# Patient Record
Sex: Male | Born: 1940 | Race: White | Hispanic: No | Marital: Married | Smoking: Never smoker
Health system: Southern US, Community
[De-identification: ages and names within clinical notes are randomized; demographics above are authoritative.]

## PROBLEM LIST (undated history)

## (undated) DIAGNOSIS — E7800 Pure hypercholesterolemia, unspecified: Secondary | ICD-10-CM

## (undated) DIAGNOSIS — I10 Essential (primary) hypertension: Secondary | ICD-10-CM

---

## 2001-03-14 ENCOUNTER — Ambulatory Visit (HOSPITAL_COMMUNITY): Admission: RE | Admit: 2001-03-14 | Discharge: 2001-03-14 | Payer: Self-pay | Admitting: Gastroenterology

## 2001-03-14 ENCOUNTER — Encounter (INDEPENDENT_AMBULATORY_CARE_PROVIDER_SITE_OTHER): Payer: Self-pay | Admitting: Specialist

## 2004-05-31 ENCOUNTER — Ambulatory Visit (HOSPITAL_COMMUNITY): Admission: RE | Admit: 2004-05-31 | Discharge: 2004-05-31 | Payer: Self-pay | Admitting: Gastroenterology

## 2004-11-18 ENCOUNTER — Ambulatory Visit (HOSPITAL_COMMUNITY): Admission: RE | Admit: 2004-11-18 | Discharge: 2004-11-18 | Payer: Self-pay | Admitting: Internal Medicine

## 2005-01-04 ENCOUNTER — Ambulatory Visit (HOSPITAL_BASED_OUTPATIENT_CLINIC_OR_DEPARTMENT_OTHER): Admission: RE | Admit: 2005-01-04 | Discharge: 2005-01-04 | Payer: Self-pay | Admitting: Otolaryngology

## 2005-01-16 ENCOUNTER — Ambulatory Visit: Payer: Self-pay | Admitting: Internal Medicine

## 2005-08-12 ENCOUNTER — Ambulatory Visit (HOSPITAL_COMMUNITY): Admission: RE | Admit: 2005-08-12 | Discharge: 2005-08-13 | Payer: Self-pay | Admitting: Otolaryngology

## 2005-08-12 ENCOUNTER — Encounter (INDEPENDENT_AMBULATORY_CARE_PROVIDER_SITE_OTHER): Payer: Self-pay | Admitting: *Deleted

## 2005-10-13 ENCOUNTER — Ambulatory Visit (HOSPITAL_BASED_OUTPATIENT_CLINIC_OR_DEPARTMENT_OTHER): Admission: RE | Admit: 2005-10-13 | Discharge: 2005-10-13 | Payer: Self-pay | Admitting: Otolaryngology

## 2005-10-16 ENCOUNTER — Ambulatory Visit: Payer: Self-pay | Admitting: Internal Medicine

## 2008-04-21 ENCOUNTER — Encounter: Admission: RE | Admit: 2008-04-21 | Discharge: 2008-04-21 | Payer: Self-pay | Admitting: Internal Medicine

## 2008-05-08 ENCOUNTER — Ambulatory Visit (HOSPITAL_COMMUNITY): Admission: RE | Admit: 2008-05-08 | Discharge: 2008-05-08 | Payer: Self-pay | Admitting: Internal Medicine

## 2008-05-08 ENCOUNTER — Encounter (INDEPENDENT_AMBULATORY_CARE_PROVIDER_SITE_OTHER): Payer: Self-pay | Admitting: Internal Medicine

## 2010-09-10 NOTE — Op Note (Signed)
NAME:  Alexander Cameron, Alexander Cameron               ACCOUNT NO.:  [id number]   MEDICAL RECORD NO.:  [id number]          PATIENT TYPE:  AMB   LOCATION:  SDS                          FACILITY:  [facility]   PHYSICIAN:  Kinnie Scales. Annalee Genta, M.D.DATE OF BIRTH:  09/26/1940   DATE OF PROCEDURE:  08/12/2005  DATE OF DISCHARGE:                                 OPERATIVE REPORT   PREOPERATIVE DIAGNOSIS:  1.  Obstructive sleep apnea.  2.  Deviated nasal septum with nasal airway obstruction.  3.  Tonsillar hypertrophy.  4.  Uvula palatal hypertrophy.  5.  Inferior turbinate hypertrophy.   POSTOPERATIVE DIAGNOSIS:  1.  Obstructive sleep apnea.  2.  Deviated nasal septum with nasal airway obstruction.  3.  Tonsillar hypertrophy.  4.  Uvula palatal hypertrophy.  5.  Inferior turbinate hypertrophy.   PROCEDURE:  1.  Nasal septoplasty.  2.  Bilateral inferior turbinate reduction.  3.  Uvulopalatopharyngoplasty.  4.  Tonsillectomy.   SURGEON:  Kinnie Scales. Annalee Genta, M.D.   COMPLICATIONS:  None.   ESTIMATED BLOOD LOSS:  50 mL.   ANESTHESIA:  General endotracheal.   SPECIMEN:  To pathology.   DISPOSITION:  The patient is transferred from the operating room to the  recovery in stable condition.   BRIEF HISTORY:  [name] is a 70 year old white male who is referred for  evaluation of obstructive sleep apnea. The patient's sleep study showed  moderately severe sleep apnea and examination revealed a severely deviated  septum with significant adenotonsillar and palatal hypertrophy. The patient  was titrated on CPAP but over the long-term, was unable where CPAP on a  consistent basis and had limited improvement in symptoms of daytime fatigue  and hypersomnolence. Given his failure to respond to CPAP and physical  examination, we discussed the various surgical treatment options to improve  his obstructive sleep apnea.  The risks, benefits, and possible  complications of the above surgical procedure were discussed  in detail with  the patient and his wife and they understood and concurred with our plan for  surgery which was scheduled for [facility] OR on August 12, 2005.   SURGICAL PROCEDURES:  The patient was brought to the operating room at [facility] OR and placed in the supine position on the operating  table. General endotracheal anesthesia was established without difficulty.  The patient was adequately anesthetized. His nasal cavity was injected with  a total of 8 mL of 1% lidocaine with 1:100,000 epinephrine injected in  submucosal fashion on the nasal septum and inferior turbinates bilaterally.  The patient was positioned on the operating table and prepped and draped in  a sterile fashion. The surgical procedure was begun with nasal septoplasty.  A left anterior hemitransfixion incision was created and a mucoperichondrial  flap was elevated from anterior to posterior on the patient's left-hand  side. The patient had a severely deviated nasal septum with near complete  obstruction of the right nasal cavity and a large left inferior septal bony  spur. This was mobilized using a 4 mm osteotome preserving the overlying  mucosa  and was removed without difficulty. The bony cartilaginous junction  was then crossed and the mucoperiosteal flap was elevated on the patient's  right-hand side. Severely deviated bone and cartilage were then resected  with a 4 mm osteotome in the mid posterior aspects of the nasal septum in  order to create a midline septum. The mid septal cartilage was removed,  morselized, and returned to the mucoperichondrial pocket and the anterior  dorsal and columellar cartilage was not implemented as this was not  significantly deviated. With morselized cartilage returned to the  mucoperichondrial pocket, the flaps were reapproximated with a 4-0 gut  suture on a Keith needle in a horizontal mattress fashion. The anterior  hemitransfixion incision was  closed with the same stitch and bilateral Doyle  nasal septal splints were placed after the application of Bactroban ointment  and sutured into position with a 3-0 Ethilon suture.   Inferior turbinate reduction was then performed beginning with bipolar  intramural cautery. Two submucosal passes were made into each inferior  turbinate. When the turbinates were adequately cauterized, they were  outfractured creating a more patent nasal cavity. The anterior inferior  aspect of each inferior turbinate was then exposed and turbinate bone was  partially resected using a through cutting forceps, preserving the overlying  mucosa.  The turbinates were then further outfractured to create a patent  nasal cavity.  The nasal cavity, nasopharynx, oral cavity, and oropharynx  were then irrigated and suctioned.   With the nasal surgery completed, attention then turned the patient's oral  pharynx. A Crowe-Davis mouth gag was inserted without difficulty. There were  no loose or broken teeth and the entire oropharynx was exposed and  inspected. The patient had significant uvulopalatal hypertrophy with  tonsillar enlargement.  A tonsillectomy was begun on the patient's left-hand  side using Bovie electrocautery and dissecting in subcapsular fashion. The  entire left tonsil was resected from superior pole to tongue base.  The  right tonsil was removed in similar fashion and the tonsillar tissue sent to  pathology for gross and microscopic evaluation. The tonsillar fossa gently  abraded with a dry tonsil sponge.  Several areas of point hemorrhage were  cauterized with suction cautery. There is no active bleeding.   Uvulopalatopharyngoplasty was then performed. Using the Bovie  electrocautery, an anterior mucosal flap was elevated along the anterior  tonsillar pillars across the palate. This was extended through the mucosa and underlying submucosa and the muscular layer of the palate exposed.  Dissection was  then carried out from superior to inferior removing muscle  and preserving posterior palatal mucosa. An approximately 1.5 cm strip of  muscle and uvula was resected and this was sent to pathology. This created a  widely patent oropharynx.  Posterior palatal mucosa was then advanced and  the posterior tonsillar pillars were approximated anteriorly using a 3-0  Vicryl suture on a tapered needle in an interrupted mattress fashion. The  posterior palatal mucosa was brought anteriorly to cover the palatal margin  and good mucosal contact was achieved throughout. The patient's oral cavity  and oropharynx were then irrigated and suctioned.  An orogastric tube was  passed and the stomach contents were aspirated. The Crowe-Davis  mouth gag was released and reapplied, there was no active bleeding.  The  mouth gag was removed. The patient was then awakened from his anesthetic,  extubated, and was transferred from the operating room to the recovery room  in stable condition. There were no complications. Blood loss  was  approximately 50 mL.           ______________________________  Kinnie Scales. Annalee Genta, M.D.     DLS/MEDQ  D:  45/40/9811  T:  08/12/2005  Job:  [id number]

## 2010-09-10 NOTE — Op Note (Signed)
NAME:  Alexander Cameron, Alexander Cameron               ACCOUNT NO.:  [id number]   MEDICAL RECORD NO.:  [id number]          PATIENT TYPE:  AMB   LOCATION:  ENDO                         [facility]ACILITY:  [facility]   PHYSICIAN:  James L. Malon Kindle., M.D.DATE OF BIRTH:  24-Oct-1940   DATE OF PROCEDURE:  05/31/2004  DATE OF DISCHARGE:                                 OPERATIVE REPORT   PROCEDURE:  Colonoscopy.   MEDICATIONS:  Fentanyl 50 mcg, Versed 6 mg IV.   INSTRUMENT USED:  Olympus pediatric adjustable colonoscope.   INDICATIONS FOR PROCEDURE:  The patient has had a previous history of  adenomatous polyps.  This is done as a followup procedure.   DESCRIPTION OF PROCEDURE:  The procedure had been explained to the patient  and consent obtained.  With the patient in the left lateral decubitus  position, the Olympus scope was inserted and advanced.  The prep was  excellent.  We were able to reach the cecum without difficulty.  The  ileocecal valve and appendiceal orifice were seen.  The scope was withdrawn.  The cecum, ascending colon, transverse colon, splenic flexure, descending  colon, and sigmoid colon were seen well, and no polyps or other lesions were  seen.  The scope was withdrawn.  The patient tolerated the procedure well.   ASSESSMENT:  History of adenomatous colon polyps with negative colonoscopy  at this time.  V12.72.   PLAN:  Will recommend yearly Hemoccults and repeat procedure in five years  due to previous history of polyps.      JLE/MEDQ  D:  05/31/2004  T:  05/31/2004  Job:  [id number]   cc:   Antony Madura, M.D.  [address], [address]  [place]  [place] [address]  Fax: [phone]

## 2010-09-10 NOTE — Procedures (Signed)
NAME:  Alexander Cameron, Alexander Cameron NO.:  [id number]   MEDICAL RECORD NO.:  [id number]          PATIENT TYPE:  OUT   LOCATION:  SLEEP CENTER                 FACILITY:  [facility]   PHYSICIAN:  Clinton D. Maple Hudson, M.D. DATE OF BIRTH:  11-Dec-1940   DATE OF STUDY:  01/04/2005                              NOCTURNAL POLYSOMNOGRAM   REFERRING PHYSICIAN:  Dr. Osborn Coho.   DATE OF STUDY:  January 04, 2005.   INDICATION FOR STUDY:  Hypersomnia with sleep apnea. Epworth sleepiness  score 5/24, BMI 33. Weight 211 pounds.   SLEEP ARCHITECTURE:  Total sleep time 322 minutes with sleep efficiency 78%.  Stage I was 7%, stage II 80%, stages III and IV absent, REM 13% of total  sleep time. Sleep latency 43 minutes, REM latency 207 minutes, awake after  sleep onset 47 minutes, arousal index 44. No bedtime medication was  reported.   RESPIRATORY DATA:  Split study protocol. Apnea/hypopnea index (AHI, RDI)  72.1 obstructive events per hour indicating severe obstructive sleep  apnea/hypopnea syndrome before CPAP. There were 91 obstructive apneas and 63  hypopneas before CPAP. Virtually all sleep was while supine. REM AHI 0. CPAP  was titrated to 16 CWP, AHI of 0 per hour, to eliminate snoring. A medium  full-face Ultra Mirage mask was used with heated humidifier.   OXYGEN DATA:  Very loud snoring with oxygen desaturation to a nadir of 81%  before CPAP. After CPAP control oxygen saturation held 94% to 96% on room  air.   CARDIAC DATA:  Sinus rhythm with sinus bradycardia, heart rate 48-52 beats  per minute.   MOVEMENT/PARASOMNIA:  A total of 284 limb jerks were reported of which 27  were associated with arousal or awakening for periodic limb movement with  arousal index of 5 per hour which is increased. This may partly reflect the  presence of sleep apnea arousals and can be reconsidered after sleep apnea  is treated.   IMPRESSION/RECOMMENDATIONS:  1.  Severe obstructive sleep apnea /  hypopnea syndrome, apnea/hypopnea index      72.1 per hour, with very loud snoring and oxygen desaturation to 81%.  2.  Successful CPAP titration to 16 centimeter of water pressure,      apnea/hypopnea index 0 per hour, using a medium full-face Ultra Mirage      mask with heated humidifier.  3.  Sinus bradycardia.  4.  Periodic limb movement with arousal, 5 per hour.      Clinton D. Maple Hudson, M.D.  Diplomate, Biomedical engineer of Sleep Medicine  Electronically Signed     CDY/MEDQ  D:  01/16/2005 08:31:57  T:  01/16/2005 14:07:27  Job:  [id number]

## 2010-09-10 NOTE — Procedures (Signed)
NAME:  Alexander Cameron, Alexander Cameron NO.:  [id number]   MEDICAL RECORD NO.:  [id number]          PATIENT TYPE:  OUT   LOCATION:  SLEEP CENTER                 [facility]ACILITY:  [facility]   PHYSICIAN:  Clinton D. Maple Hudson, M.D. DATE OF BIRTH:  [birth year]   DATE OF STUDY:  10/13/2005                              NOCTURNAL POLYSOMNOGRAM   REFERRED BY:  Onalee Hua L. Annalee Genta, M.D.   INDICATIONS FOR STUDY:  Hypersomnia with obstructive sleep apnea.   EPWORTH SLEEPINESS SCORE:  3/24, BMI 31.9.  Weight 204 pounds.   MEDICATIONS:  Flonase, simvastatin, triazolam, Nexium, atenolol, fluticasone  albuterol.   A diagnostic NPSG on January 04, 2005 had recorded an HPI of 72.1 per  hour.  The patient is now evaluated postoperatively.   SLEEP ARCHITECTURE:  Total sleep time 280 minutes with sleep efficiency 70%.  Stage I was 12%, stage II 80%, stages III and IV were absent, REM 7% of  total sleep time.  Sleep latency 32 minutes, REM latency 164 minutes, awake  after sleep onset 91 minutes, arousal index increased to 53.  Fluticasone  was taken at 9:30 p.m.   RESPIRATORY DATA:  Split study protocol.  Apnea hypopnea index (AHI, RDI)  49.5 obstructive events per hour before CPAP  indicating moderately severe  obstructive sleep apnea/hypopnea syndrome.  This included two obstructive  apneas and 104 hypopneas with all events recorded during the first half the  study while sleeping supine.  REM AHI 31.4.  CPAP was titrated to 8 CWP, AHI  1.8 per hour.  A medium ResMed ultra mirage full-face mask was used with a  heated humidifier.   OXYGEN DATA:  Moderate to loud snoring with oxygen desaturation to a nadir  of 85%.  Mean oxygen saturation after CPAP control was 94-96% on room air.   CARDIAC DATA:  Normal sinus rhythm with sinus bradycardia at 47-51 beats per  minute.   MOVEMENT/PARASOMNIA:  A total of 440 limb jerks were recorded of which 88  were associated with arousal or awakening for periodic limb  movement with  arousal index of 18.8 per hour which is probably significant.   IMPRESSION/RECOMMENDATIONS:  1.  Moderately severe obstructive sleep apnea/hypopnea syndrome, AHI 49.5      per hour with positional, mostly supine events, moderately loud snoring      and oxygen desaturation to 85%.  These scores represent significant      improvement over the baseline study on January 04, 2005 which recorded      an AHP of 72.1 per hour and required CPAP with 16 CWP for control.  2.  Successful control with CPAP 8 CWP, AHI 1.8 per hour.  A medium ResMed      ultra mirage full-face mask was      used with a heated humidifier.  3.  Periodic limb movement with arousal, 18.8 per hour.      Clinton D. Maple Hudson, M.D.  Diplomate, Biomedical engineer of Sleep Medicine  Electronically Signed     CDY/MEDQ  D:  10/16/2005 12:43:31  T:  10/17/2005 07:26:50  Job:  [id number]

## 2010-09-10 NOTE — Procedures (Signed)
[facility]  Patient:    ATHAN, CASALINO Visit Number: [id number] MRN: [id number]          Service Type: END Location: ENDO Attending Physician:  Orland Mustard Dictated by:   Llana Aliment. Randa Evens, M.D. Proc. Date: 03/14/01 Admit Date:  03/14/2001   CC:         Antony Madura, M.D.   Procedure Report  PROCEDURE PERFORMED:  Colonoscopy and coagulation of polyp.  ENDOSCOPIST:  Llana Aliment. Randa Evens, M.D.  MEDICATIONS USED:  Fentanyl 100 mcg, Versed 8 mg IV.  INSTRUMENT:  INDICATIONS:  Routine colon cancer screening.  DESCRIPTION OF PROCEDURE:  The procedure had been explained to the patient and consent obtained.  With the patient in the left lateral decubitus position, the Olympus adult video colonoscope was inserted and advanced under direct visualization.  The prep was excellent and we were able to advance to the cecum without difficulty.  The ileocecal valve was seen and the appendiceal orifice was identified.  The scope was withdrawn and the colon carefully examined on the way out.  The cecum, ascending colon, hepatic flexure, transverse colon, splenic flexure, descending and sigmoid colon were seen well.  In the middescending colon, a 3 mm polyp was encountered.  It was biopsied and cauteries.  No other polyps were seen.  There was no significant diverticular disease.   Scope withdrawn, patient tolerated the procedure well.  Maintained on low flow oxygen and pulse oximeter throughout the procedure.  ASSESSMENT:  Descending colon polyp cauterized.  Otherwise negative colonoscopy.  PLAN:  Will check path and routine postpolypectomy instructions and make further recommendations after the path report is returned. Dictated by:   Llana Aliment. Randa Evens, M.D. Attending Physician:  Orland Mustard DD:  03/14/01 TD:  03/14/01 Job: [id number]

## 2011-11-04 ENCOUNTER — Ambulatory Visit
Admission: RE | Admit: 2011-11-04 | Discharge: 2011-11-04 | Disposition: A | Discharge status: Home / Self Care | Payer: Medicare Other | Source: Ambulatory Visit | Attending: Internal Medicine | Admitting: Internal Medicine

## 2011-11-04 ENCOUNTER — Other Ambulatory Visit: Payer: Self-pay | Admitting: Internal Medicine

## 2011-11-04 DIAGNOSIS — R52 Pain, unspecified: Secondary | ICD-10-CM

## 2014-04-13 ENCOUNTER — Emergency Department (HOSPITAL_COMMUNITY)
Admission: EM | Admit: 2014-04-13 | Discharge: 2014-04-13 | Disposition: A | Discharge status: Home / Self Care | Payer: Medicare Other | Attending: Emergency Medicine | Admitting: Emergency Medicine

## 2014-04-13 ENCOUNTER — Emergency Department (HOSPITAL_COMMUNITY): Payer: Medicare Other

## 2014-04-13 ENCOUNTER — Encounter (HOSPITAL_COMMUNITY): Payer: Self-pay | Admitting: *Deleted

## 2014-04-13 DIAGNOSIS — M25512 Pain in left shoulder: Secondary | ICD-10-CM | POA: Insufficient documentation

## 2014-04-13 DIAGNOSIS — I10 Essential (primary) hypertension: Secondary | ICD-10-CM | POA: Diagnosis not present

## 2014-04-13 DIAGNOSIS — Z7982 Long term (current) use of aspirin: Secondary | ICD-10-CM | POA: Insufficient documentation

## 2014-04-13 DIAGNOSIS — E780 Pure hypercholesterolemia: Secondary | ICD-10-CM | POA: Diagnosis not present

## 2014-04-13 DIAGNOSIS — E785 Hyperlipidemia, unspecified: Secondary | ICD-10-CM | POA: Diagnosis not present

## 2014-04-13 DIAGNOSIS — E119 Type 2 diabetes mellitus without complications: Secondary | ICD-10-CM | POA: Diagnosis not present

## 2014-04-13 DIAGNOSIS — R079 Chest pain, unspecified: Secondary | ICD-10-CM

## 2014-04-13 DIAGNOSIS — Z79899 Other long term (current) drug therapy: Secondary | ICD-10-CM | POA: Insufficient documentation

## 2014-04-13 HISTORY — DX: Essential (primary) hypertension: I10

## 2014-04-13 HISTORY — DX: Pure hypercholesterolemia, unspecified: E78.00

## 2014-04-13 LAB — CBC
HCT: 44.1 % (ref 39.0–52.0)
Hemoglobin: 14.6 g/dL (ref 13.0–17.0)
MCH: 27.8 pg (ref 26.0–34.0)
MCHC: 33.1 g/dL (ref 30.0–36.0)
MCV: 84 fL (ref 78.0–100.0)
PLATELETS: 224 10*3/uL (ref 150–400)
RBC: 5.25 MIL/uL (ref 4.22–5.81)
RDW: 13.2 % (ref 11.5–15.5)
WBC: 7.2 10*3/uL (ref 4.0–10.5)

## 2014-04-13 LAB — BASIC METABOLIC PANEL
Anion gap: 13 (ref 5–15)
BUN: 21 mg/dL (ref 6–23)
CHLORIDE: 100 meq/L (ref 96–112)
CO2: 25 mEq/L (ref 19–32)
CREATININE: 0.81 mg/dL (ref 0.50–1.35)
Calcium: 9.7 mg/dL (ref 8.4–10.5)
GFR calc Af Amer: >90 mL/min (ref >90)
GFR calc non Af Amer: 85 mL/min — ABNORMAL LOW (ref >90)
GLUCOSE: 108 mg/dL — AB (ref 70–99)
POTASSIUM: 4.5 meq/L (ref 3.7–5.3)
SODIUM: 138 meq/L (ref 137–147)

## 2014-04-13 LAB — I-STAT TROPONIN, ED: Troponin i, poc: 0.02 ng/mL (ref 0.00–0.08)

## 2014-04-13 MED ORDER — HYDROCODONE-ACETAMINOPHEN 5-325 MG PO TABS: 1.0000 | ORAL_TABLET | Freq: Four times a day (QID) | ORAL | Status: AC | PRN

## 2014-04-13 MED ORDER — HYDROCODONE-ACETAMINOPHEN 5-325 MG PO TABS
1.0000 | ORAL_TABLET | Freq: Once | ORAL | Status: AC
Start: 2014-04-13 — End: 2014-04-13
  Administered 2014-04-13: 1 via ORAL
  Filled 2014-04-13: qty 1

## 2014-04-13 MED ORDER — NAPROXEN 500 MG PO TABS: 500.0000 mg | ORAL_TABLET | Freq: Two times a day (BID) | ORAL | Status: AC

## 2014-04-13 NOTE — ED Provider Notes (Signed)
CSN: [id number]     Arrival date & time 04/13/14  1346 History   First MD Initiated Contact with Patient 04/13/14 1508     Chief Complaint  Patient presents with  . Shoulder Pain     (Consider location/radiation/quality/duration/timing/severity/associated sxs/prior Treatment) HPI Comments: Patient is a 73 yo M PMHx significant for HTN, HLD, DM [facility]. [facility] of left shoulder pain. He states his pain is typically worse in the evening, describes it as throbbing in nature without radiation. He states laying on his shoulder and overhead movements aggravate his pain. Denies any CP, SOB, diaphoresis, N/V. Patient had a stress test performed in June 2015 w/o abnormality. No CP or SOB with exertion. No h/o cardiac catheterizations.   Patient is a 73 y.o. male presenting with shoulder pain.  Shoulder Pain   Past Medical History  Diagnosis Date  . Hypertension   . High cholesterol    History reviewed. No pertinent past surgical history. History reviewed. No pertinent family history. History  Substance Use Topics  . Smoking status: Never Smoker   . Smokeless tobacco: Not on file  . Alcohol Use: No    Review of Systems  Respiratory: Negative for shortness of breath.   Cardiovascular: Negative for chest pain, palpitations and leg swelling.  Gastrointestinal: Negative for nausea and vomiting.  Musculoskeletal: Positive for myalgias and arthralgias.  All other systems reviewed and are negative.     Allergies  Review of patient's allergies indicates no known allergies.  Home Medications   Prior to Admission medications   Medication Sig Start Date End Date Taking? Authorizing Provider  amLODipine (NORVASC) 5 MG tablet Take 5 mg by mouth daily. 04/11/14  Yes Historical Provider, MD  aspirin EC 81 MG tablet Take 81 mg by mouth daily.   Yes Historical Provider, MD    levothyroxine (SYNTHROID, LEVOTHROID) 50 MCG tablet Take 50 mcg by mouth daily. 02/03/14  Yes Historical Provider, MD  losartan (COZAAR) 25 MG tablet Take 25 mg by mouth daily. 03/25/14  Yes Historical Provider, MD  metFORMIN (GLUCOPHAGE) 500 MG tablet Take 500 mg by mouth daily. 03/21/14  Yes Historical Provider, MD  simvastatin (ZOCOR) 20 MG tablet Take 20 mg by mouth daily. 01/07/14  Yes Historical Provider, MD  triazolam (HALCION) 0.25 MG tablet Take 0.25 mg by mouth daily. 04/09/14  Yes Historical Provider, MD  HYDROcodone-acetaminophen (NORCO/VICODIN) 5-325 MG per tablet Take 1-2 tablets by mouth every 6 (six) hours as needed for severe pain. 04/13/14   Eligha Kmetz L Tineshia Becraft, PA-C  naproxen (NAPROSYN) 500 MG tablet Take 1 tablet (500 mg total) by mouth 2 (two) times daily with a meal. 04/13/14   Estellar Cadena L Ola Raap, PA-C   BP 133/51 mmHg  Pulse 55  Temp(Src) 98.2 F (36.8 C) (Oral)  Resp 16  Ht 5\' 7"  (1.702 m)  Wt 220 lb (99.791 kg)  BMI 34.45 kg/m2  SpO2 97% Physical Exam  Constitutional: He is oriented to person, place, and time. He appears well-developed and well-nourished. No distress.  HENT:  Head: Normocephalic and atraumatic.  Right Ear: External ear normal.  Left Ear: External ear normal.  Nose: Nose normal.  Mouth/Throat: Oropharynx is clear and moist. No oropharyngeal exudate.  Eyes: Conjunctivae are normal.  Neck: Neck supple.  Cardiovascular: Normal rate, regular rhythm, normal heart sounds and intact distal pulses.   Pulmonary/Chest: Effort normal and breath sounds  normal. No respiratory distress.  Abdominal: Soft. There is no tenderness.  Musculoskeletal: Normal range of motion. He exhibits tenderness.       Right shoulder: Normal.       Left shoulder: He exhibits tenderness. He exhibits normal range of motion, no swelling, no deformity, no laceration, normal pulse and normal strength.       Cervical back: Normal.       Right upper arm: Normal.       Left  upper arm: Normal.       Arms: L shoulder pain with ROM overhead Negative Empty Can Test Negative Adson's maneuver ROM intact with Apley Scratch Test  Neurological: He is alert and oriented to person, place, and time. GCS eye subscore is 4. GCS verbal subscore is 5. GCS motor subscore is 6.  Skin: Skin is warm and dry. He is not diaphoretic.  Nursing note and vitals reviewed.   ED Course  Procedures (including critical care time) Medications  HYDROcodone-acetaminophen (NORCO/VICODIN) 5-325 MG per tablet 1 tablet (1 tablet Oral Given 04/13/14 1518)    Labs Review Labs Reviewed  BASIC METABOLIC PANEL - Abnormal; Notable for the following:    Glucose, Bld 108 (*)    GFR calc non Af Amer 85 (*)    All other components within normal limits  CBC  I-STAT TROPOININ, ED    Imaging Review Dg Chest 2 View  04/13/2014   CLINICAL DATA:  Abnormal electrocardiogram.  Hypertension  EXAM: CHEST  2 VIEW  COMPARISON:  April 21, 2008  FINDINGS: There is no edema or consolidation. Heart is borderline enlarged with pulmonary vascularity within normal limits. No adenopathy. No bone lesions.  IMPRESSION: Borderline cardiomegaly.  No edema or consolidation.   Electronically Signed   By: Bretta BangWilliam  Woodruff M.D.   On: 04/13/2014 15:12   Dg Shoulder Left  04/13/2014   CLINICAL DATA:  Left shoulder pain for 2 weeks, worsening.  EXAM: LEFT SHOULDER - 2+ VIEW  COMPARISON:  None.  FINDINGS: The humerus is located and the acromioclavicular joint is intact. There is no fracture. Acromioclavicular osteoarthritis is noted. Imaged left lung and ribs appear normal.  IMPRESSION: No acute finding.  Acromioclavicular osteoarthritis.   Electronically Signed   By: Drusilla Kannerhomas  Dalessio M.D.   On: 04/13/2014 15:52     EKG Interpretation   Date/Time:  Sunday April 13 2014 13:53:12 EST Ventricular Rate:  67 PR Interval:  188 QRS Duration: 88 QT Interval:  394 QTC Calculation: 416 R Axis:   57 Text Interpretation:   Normal sinus rhythm Nonspecific ST abnormality  Abnormal ECG No significant change was found Confirmed by Effie ShyWENTZ  MD,  ELLIOTT ([id number]) on 04/13/2014 3:12:23 PM      MDM   Final diagnoses:  Left shoulder pain    Filed Vitals:   04/13/14 1617  BP: 133/51  Pulse: 55  Temp:   Resp: 16   Afebrile, NAD, non-toxic appearing, AAOx4.  I have reviewed nursing notes, vital signs, and all appropriate lab and imaging results for this patient. Pain is likely not cardiac in nature. Patient is to be discharged with recommendation to follow up with PCP in regards to today's hospital visit. Shoulder pain is not likely of cardiac or pulmonary etiology d/t presentation, VSS, no tracheal deviation, no JVD or new murmur, RRR, breath sounds equal bilaterally, EKG without acute abnormalities, negative troponin, and negative CXR. PE shows no instability, tenderness, or deformity of acromioclavicular and sternoclavicular joints, the cervical spine, glenohumeral joint, coracoid  process, acromion, or scapula. Good shoulder strength during empty can test. Good ROM during scratch test. No signs of impingement on Adson's maneuver. Pain managed in ED. Return precautions discussed Patient is agreeable to plan.  Patient is stable at time of discharge. Case has been discussed with and seen by Dr. Effie Shy who agrees with the above plan to discharge.        Jeannetta Ellis, PA-C 04/13/14 2240  Flint Melter, MD 04/13/14 [phone]

## 2014-04-13 NOTE — ED Notes (Signed)
Pt sent here from [facility], reports intermittent left shoulder pain x 2 weeks. Pain becoming more severe and unable to lay on left side when he sleeps. Sent here due to abnormal ekgs. Pt denies any cp or sob.

## 2014-04-13 NOTE — ED Notes (Signed)
MD Wentz at bedside.  

## 2014-04-13 NOTE — ED Provider Notes (Signed)
  Face-to-face evaluation   History: He presents for evaluation of left shoulder pain that occurs at night, while sleeping or when moving his left arm away from his body.  He does not have chest pain, shortness of breath, nausea, vomiting, diaphoresis, weakness or dizziness.  He was seen at an urgent care and sent here for evaluation.  He states that his primary care did a bicycle cardiac stress test, several months ago, and that the results were reassuring.   Physical exam: Alert, calm, cooperative.  Heart regular rate and rhythm, no murmur.  Lungs clear to auscultation.  Left shoulder is nontender to palpation.  He has pain with adduction, of the left shoulder.  He also has mild weakness with stress on the left rotator cuff.  Medical screening examination/treatment/procedure(s) were conducted as a shared visit with non-physician practitioner(s) and myself.  I personally evaluated the patient during the encounter  Flint MelterElliott L Cruze Zingaro, MD 04/13/14 [id number]

## 2014-04-13 NOTE — Discharge Instructions (Signed)
Please follow up with your primary care physician in 1-2 days. If you do not have one please call the [facility] listed above. Please take pain medication and/or muscle relaxants as prescribed and as needed for pain. Please do not drive on narcotic pain medication or on muscle relaxants. Please read all discharge instructions and return precautions.   Osteoarthritis Osteoarthritis is a disease that causes soreness and inflammation of a joint. It occurs when the cartilage at the affected joint wears down. Cartilage acts as a cushion, covering the ends of bones where they meet to form a joint. Osteoarthritis is the most common form of arthritis. It often occurs in older people. The joints affected most often by this condition include those in the:  Ends of the fingers.  Thumbs.  Neck.  Lower back.  Knees.  Hips. CAUSES  Over time, the cartilage that covers the ends of bones begins to wear away. This causes bone to rub on bone, producing pain and stiffness in the affected joints.  RISK FACTORS Certain factors can increase your chances of having osteoarthritis, including:  Older age.  Excessive body weight.  Overuse of joints.  Previous joint injury. SIGNS AND SYMPTOMS   Pain, swelling, and stiffness in the joint.  Over time, the joint may lose its normal shape.  Small deposits of bone (osteophytes) may grow on the edges of the joint.  Bits of bone or cartilage can break off and float inside the joint space. This may cause more pain and damage. DIAGNOSIS  Your health care provider will do a physical exam and ask about your symptoms. Various tests may be ordered, such as:  X-rays of the affected joint.  An MRI scan.  Blood tests to rule out other types of arthritis.  Joint fluid tests. This involves using a needle to draw fluid from the joint and examining the fluid under a microscope. TREATMENT  Goals of treatment are to control pain and improve  joint function. Treatment plans may include:  A prescribed exercise program that allows for rest and joint relief.  A weight control plan.  Pain relief techniques, such as:  Properly applied heat and cold.  Electric pulses delivered to nerve endings under the skin (transcutaneous electrical nerve stimulation [TENS]).  Massage.  Certain nutritional supplements.  Medicines to control pain, such as:  Acetaminophen.  Nonsteroidal anti-inflammatory drugs (NSAIDs), such as naproxen.  Narcotic or central-acting agents, such as tramadol.  Corticosteroids. These can be given orally or as an injection.  Surgery to reposition the bones and relieve pain (osteotomy) or to remove loose pieces of bone and cartilage. Joint replacement may be needed in advanced states of osteoarthritis. HOME CARE INSTRUCTIONS   Take medicines only as directed by your health care provider.  Maintain a healthy weight. Follow your health care provider's instructions for weight control. This may include dietary instructions.  Exercise as directed. Your health care provider can recommend specific types of exercise. These may include:  Strengthening exercises. These are done to strengthen the muscles that support joints affected by arthritis. They can be performed with weights or with exercise bands to add resistance.  Aerobic activities. These are exercises, such as brisk walking or low-impact aerobics, that get your heart pumping.  Range-of-motion activities. These keep your joints limber.  Balance and agility exercises. These help you maintain daily living skills.  Rest your affected joints as directed by your health care provider.  Keep all follow-up visits as directed by  your health care provider. SEEK MEDICAL CARE IF:   Your skin turns red.  You develop a rash in addition to your joint pain.  You have worsening joint pain.  You have a fever along with joint or muscle aches. SEEK IMMEDIATE  MEDICAL CARE IF:  You have a significant loss of weight or appetite.  You have night sweats. FOR MORE INFORMATION   National Institute of Arthritis and Musculoskeletal and Skin Diseases: www.niams.http://www.myers.net/nih.gov  [employer]: https://walker.com/www.nia.nih.gov  American College of Rheumatology: www.rheumatology.org Document Released: 04/11/2005 Document Revised: 08/26/2013 Document Reviewed: 12/17/2012 [facility] Patient Information 2015 [place], [place]. This information is not intended to replace advice given to you by your health care provider. Make sure you discuss any questions you have with your health care provider.  Shoulder Pain The shoulder is the joint that connects your arms to your body. The bones that form the shoulder joint include the upper arm bone (humerus), the shoulder blade (scapula), and the collarbone (clavicle). The top of the humerus is shaped like a ball and fits into a rather flat socket on the scapula (glenoid cavity). A combination of muscles and strong, fibrous tissues that connect muscles to bones (tendons) support your shoulder joint and hold the ball in the socket. Small, fluid-filled sacs (bursae) are located in different areas of the joint. They act as cushions between the bones and the overlying soft tissues and help reduce friction between the gliding tendons and the bone as you move your arm. Your shoulder joint allows a wide range of motion in your arm. This range of motion allows you to do things like scratch your back or throw a ball. However, this range of motion also makes your shoulder more prone to pain from overuse and injury. Causes of shoulder pain can originate from both injury and overuse and usually can be grouped in the following four categories:  Redness, swelling, and pain (inflammation) of the tendon (tendinitis) or the bursae (bursitis).  Instability, such as a dislocation of the joint.  Inflammation of the joint (arthritis).  Broken bone  (fracture). HOME CARE INSTRUCTIONS   Apply ice to the sore area.  Put ice in a plastic bag.  Place a towel between your skin and the bag.  Leave the ice on for 15-20 minutes, 3-4 times per day for the first 2 days, or as directed by your health care provider.  Stop using cold packs if they do not help with the pain.  If you have a shoulder sling or immobilizer, wear it as long as your caregiver instructs. Only remove it to shower or bathe. Move your arm as little as possible, but keep your hand moving to prevent swelling.  Squeeze a soft ball or foam pad as much as possible to help prevent swelling.  Only take over-the-counter or prescription medicines for pain, discomfort, or fever as directed by your caregiver. SEEK MEDICAL CARE IF:   Your shoulder pain increases, or new pain develops in your arm, hand, or fingers.  Your hand or fingers become cold and numb.  Your pain is not relieved with medicines. SEEK IMMEDIATE MEDICAL CARE IF:   Your arm, hand, or fingers are numb or tingling.  Your arm, hand, or fingers are significantly swollen or turn white or blue. MAKE SURE YOU:   Understand these instructions.  Will watch your condition.  Will get help right away if you are not doing well or get worse. Document Released: 01/19/2005 Document Revised: 08/26/2013 Document Reviewed: 03/26/2011  ExitCare Patient Information 2015 [place]. This information is not intended to replace advice given to you by your health care provider. Make sure you discuss any questions you have with your health care provider.

## 2015-02-16 ENCOUNTER — Other Ambulatory Visit: Payer: Self-pay | Admitting: Internal Medicine

## 2015-02-16 DIAGNOSIS — N3090 Cystitis, unspecified without hematuria: Secondary | ICD-10-CM

## 2015-02-18 ENCOUNTER — Ambulatory Visit
Admission: RE | Admit: 2015-02-18 | Discharge: 2015-02-18 | Disposition: A | Discharge status: Home / Self Care | Payer: Medicare Other | Source: Ambulatory Visit | Attending: Internal Medicine | Admitting: Internal Medicine

## 2015-02-18 DIAGNOSIS — N3090 Cystitis, unspecified without hematuria: Secondary | ICD-10-CM

## 2015-05-15 IMAGING — DX DG CHEST 2V
2 series · 2 of 2 positions shown · non-contrast
Comparison: April 21, 2008

CLINICAL DATA: Abnormal electrocardiogram.  Hypertension

EXAM:
CHEST  2 VIEW

[chest pa]
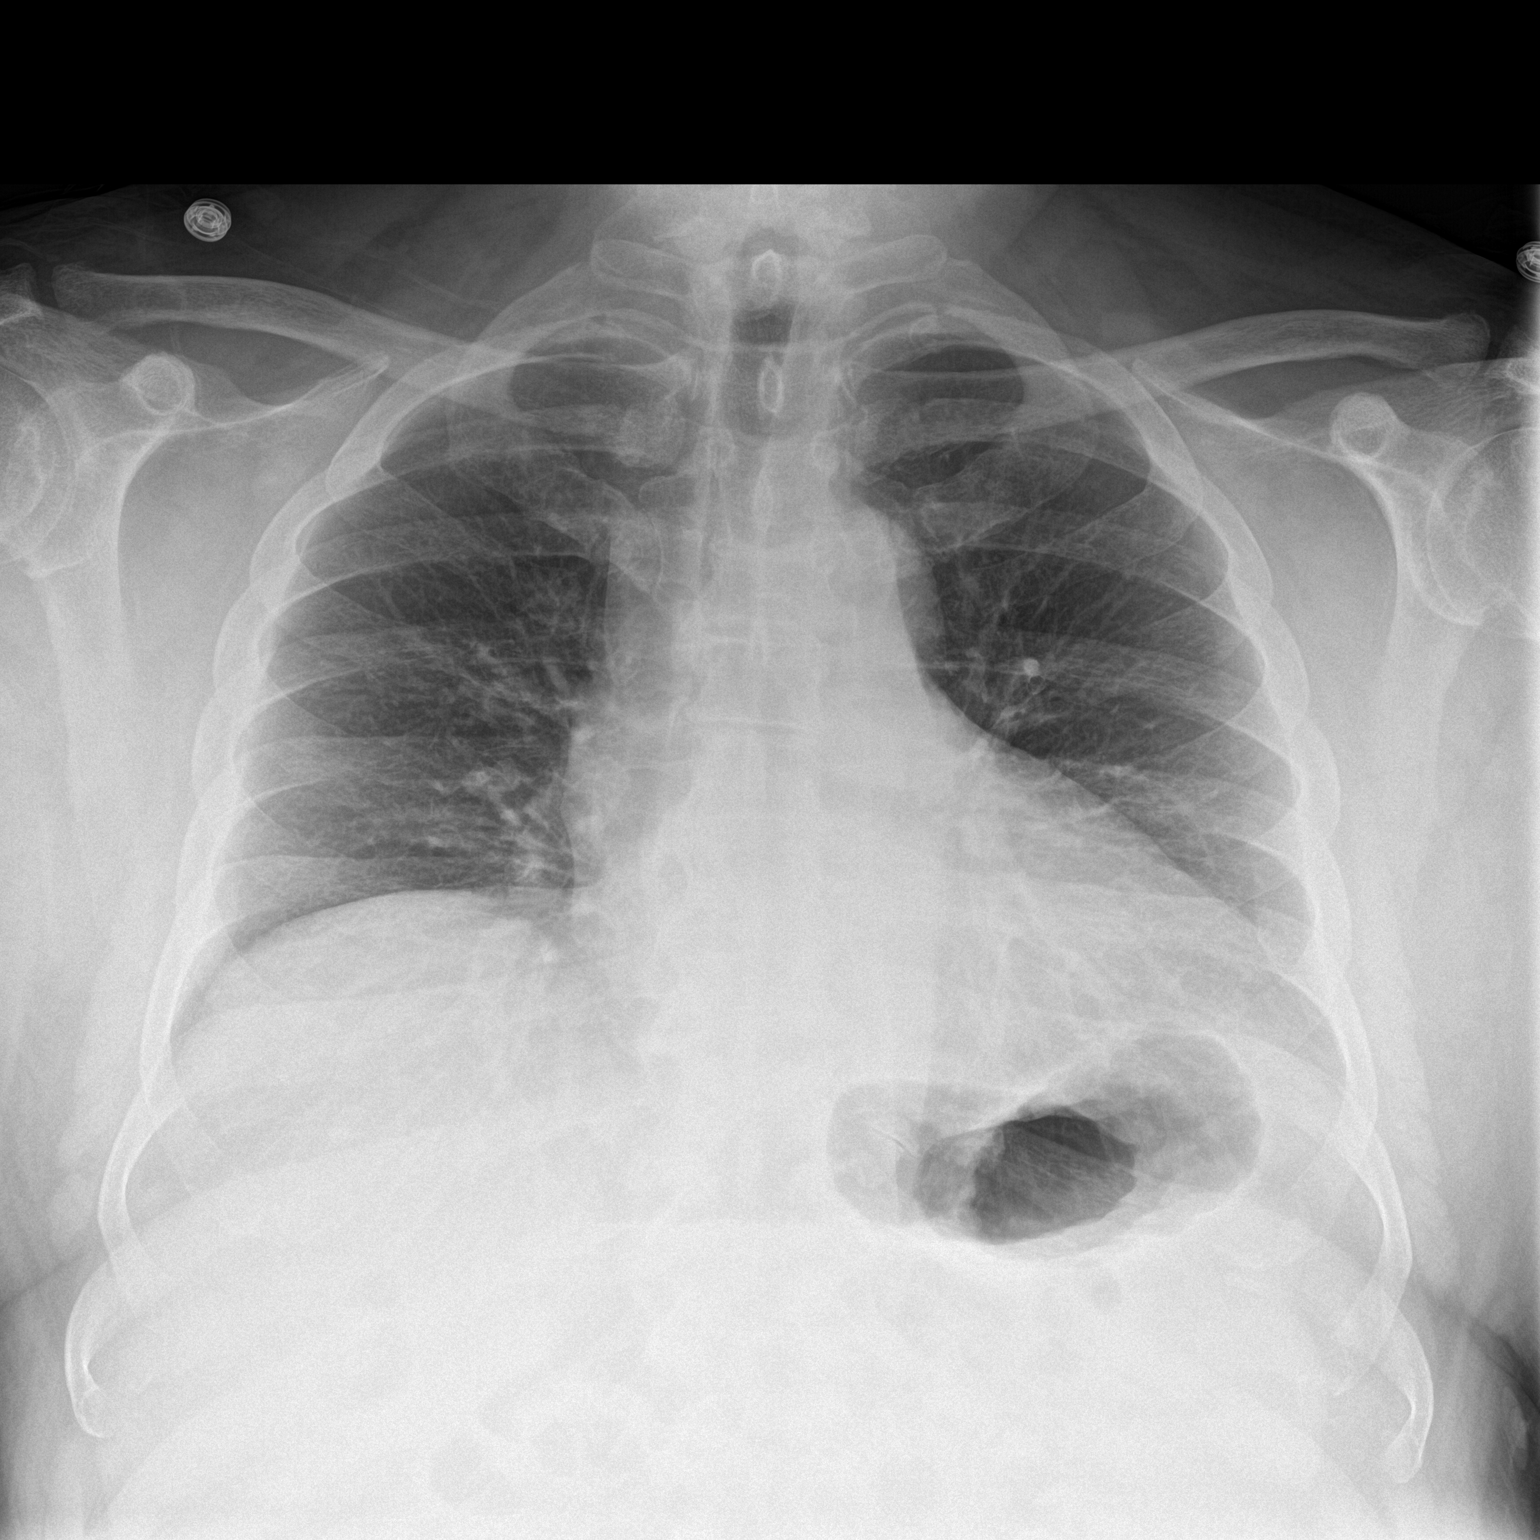

[chest lat]
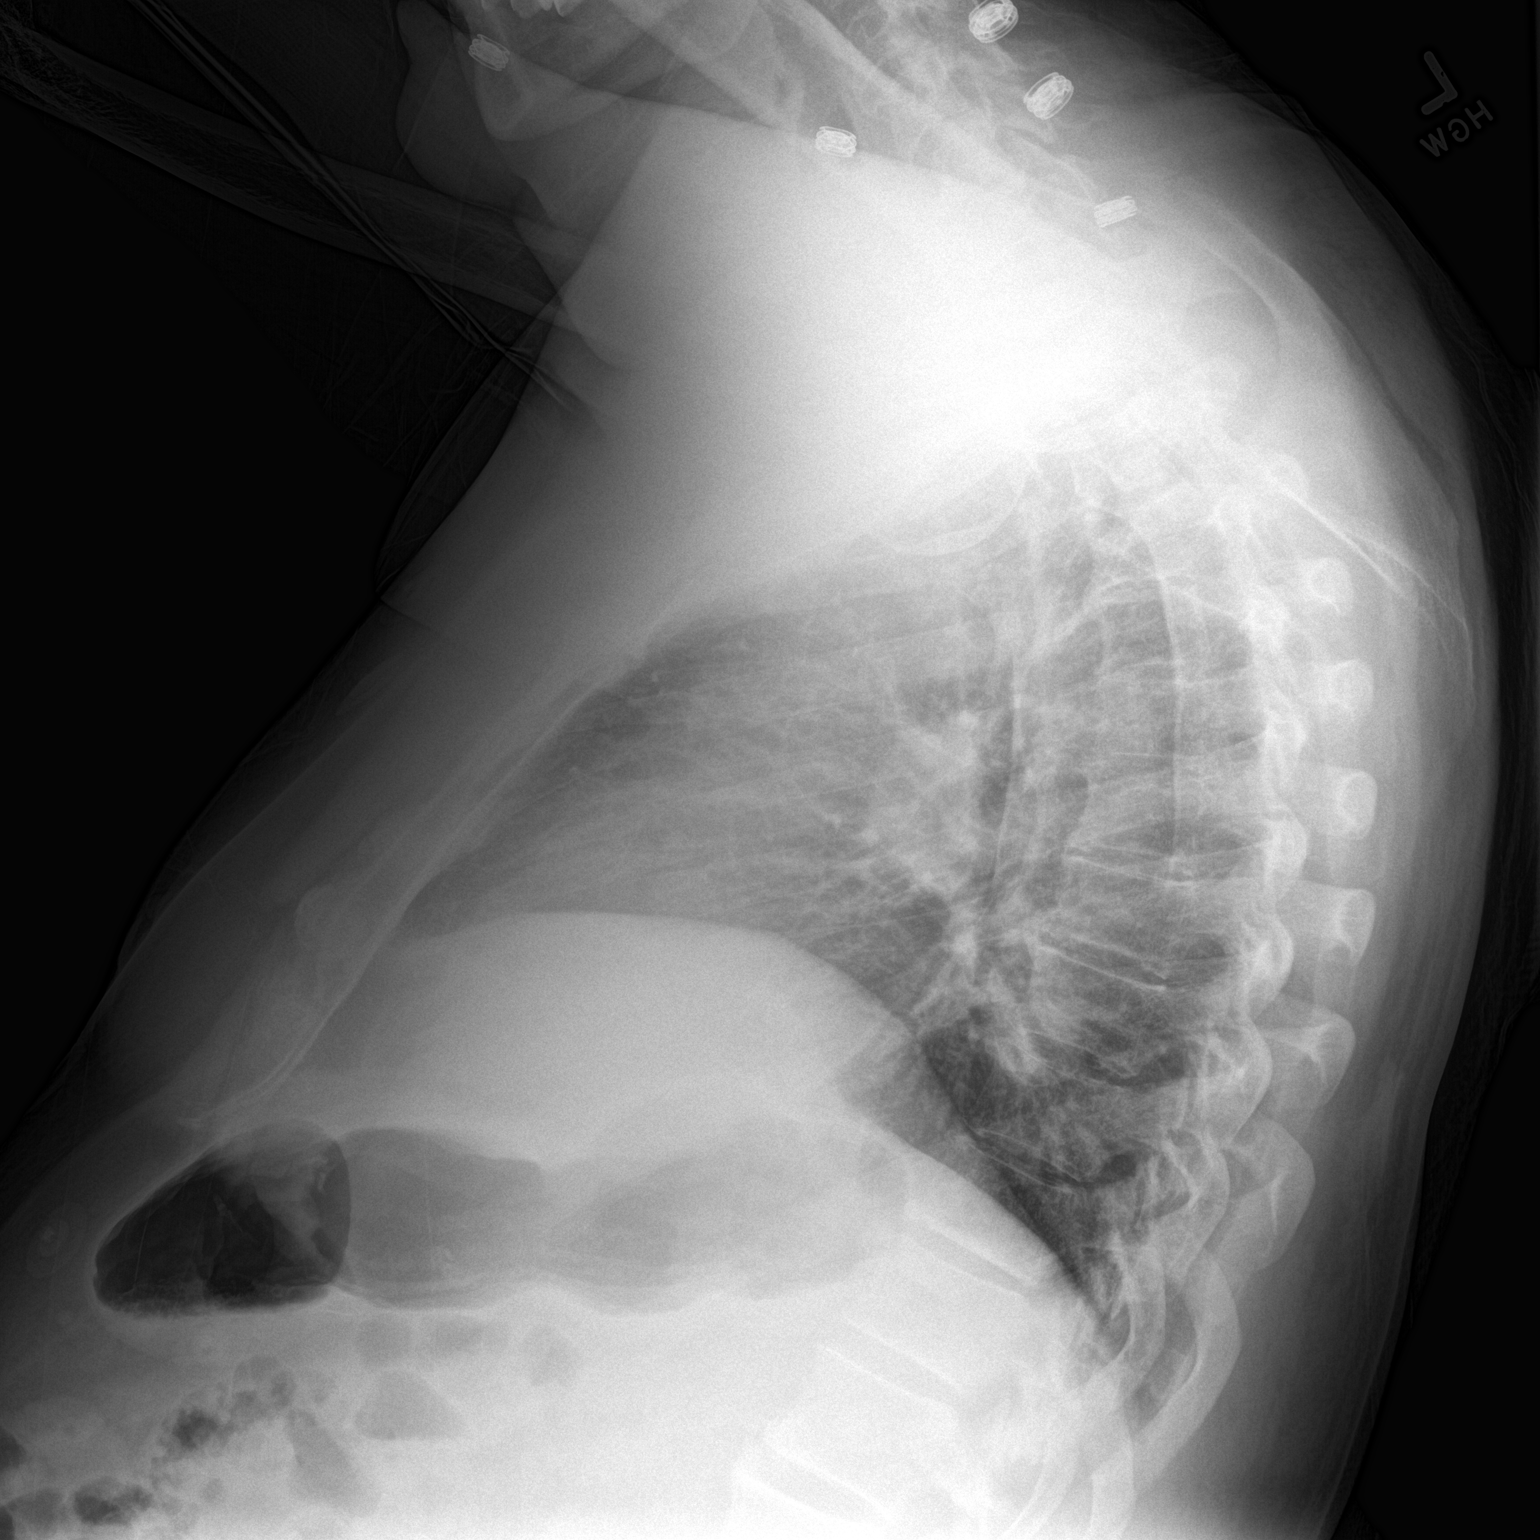

[2 of 2 positions shown; findings below may reference images not displayed]

FINDINGS: There is no edema or consolidation. Heart is borderline enlarged
with pulmonary vascularity within normal limits. No adenopathy. No
bone lesions.
IMPRESSION: Borderline cardiomegaly.  No edema or consolidation.

## 2015-05-15 IMAGING — DX DG SHOULDER 2+V*L*
3 series · 3 of 3 positions shown · non-contrast
Comparison: None.

CLINICAL DATA: Left shoulder pain for 2 weeks, worsening.

EXAM:
LEFT SHOULDER - 2+ VIEW

[shoulder grashey]
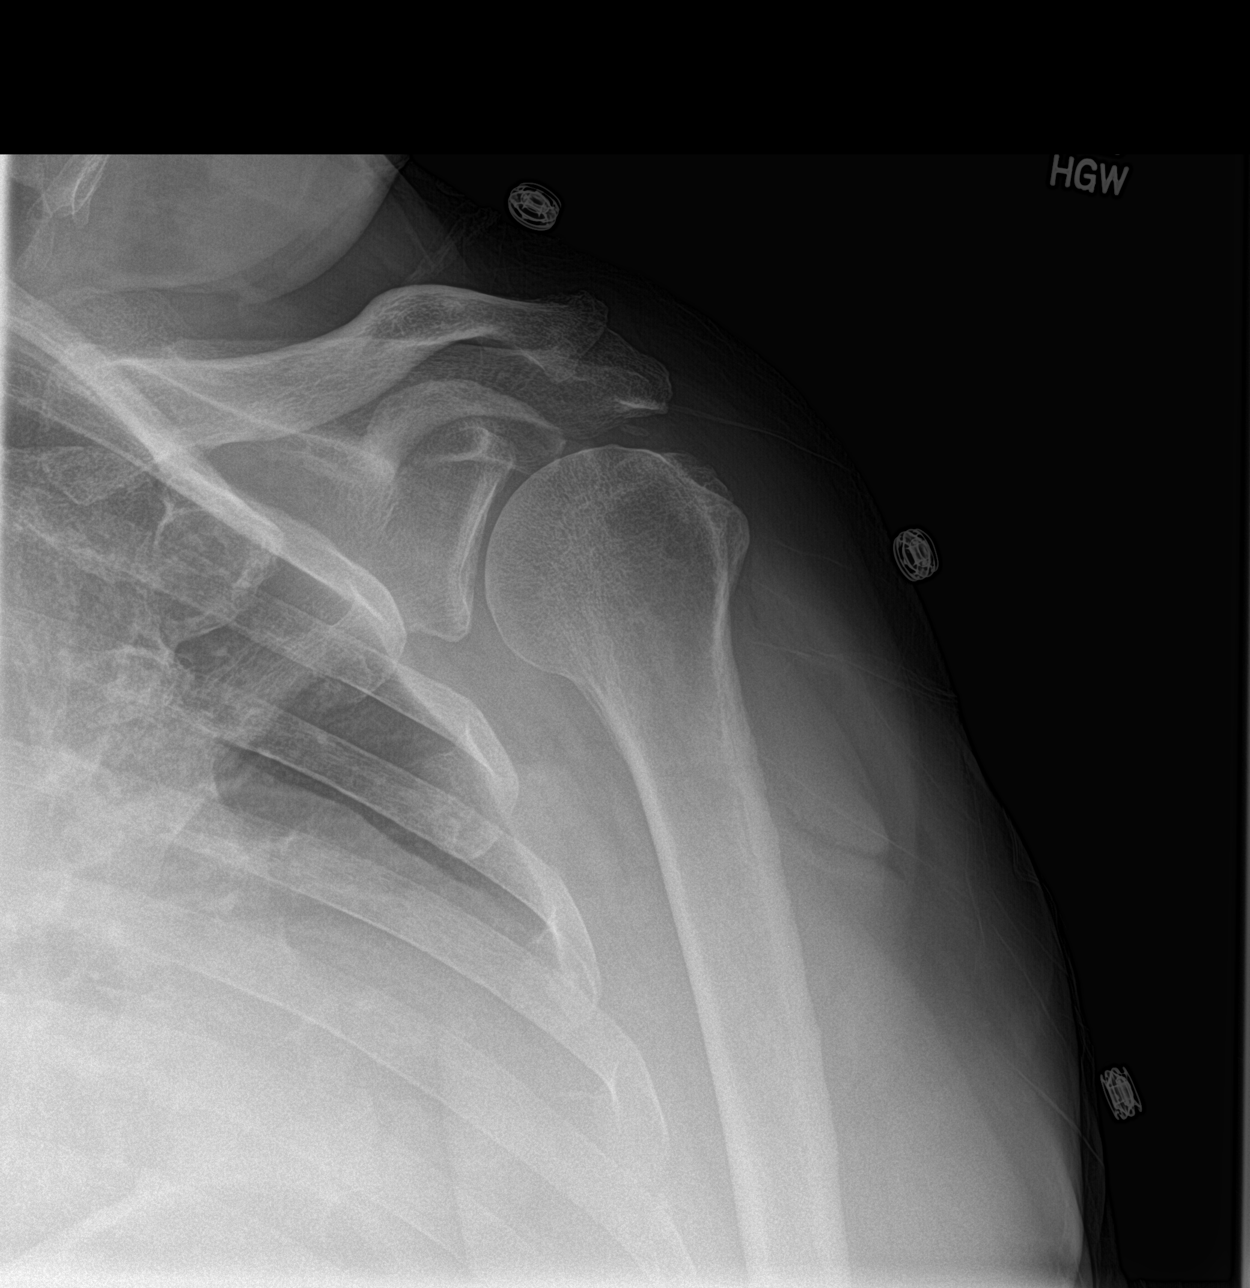

[shoulder y view]
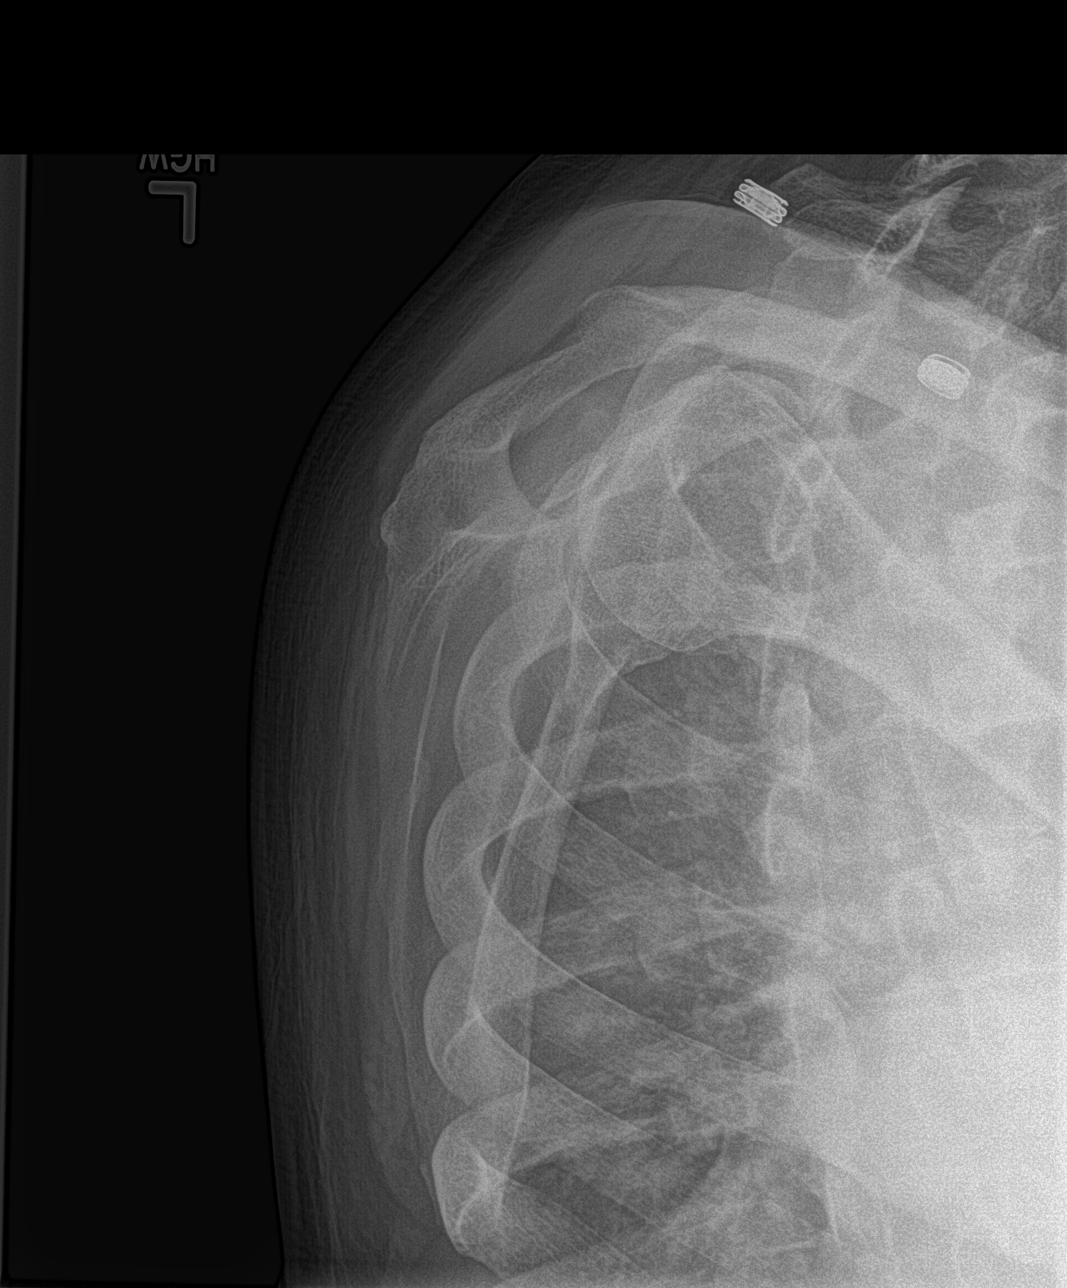

[shoulder axillary]
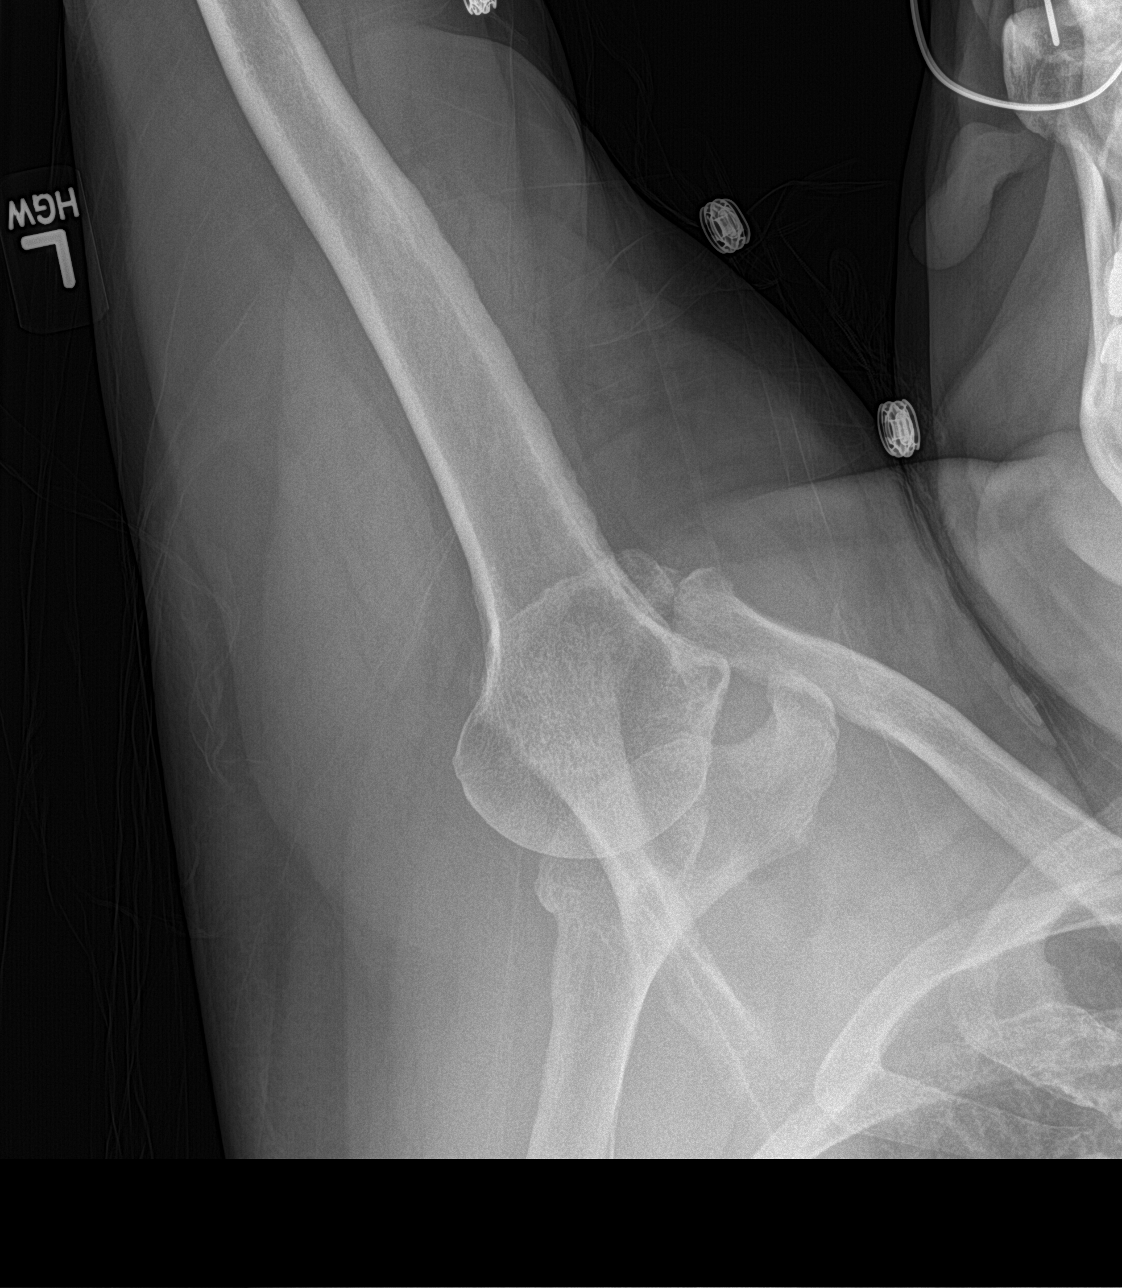

[3 of 3 positions shown; findings below may reference images not displayed]

FINDINGS: The humerus is located and the acromioclavicular joint is intact.
There is no fracture. Acromioclavicular osteoarthritis is noted.
Imaged left lung and ribs appear normal.
IMPRESSION: No acute finding.

Acromioclavicular osteoarthritis.

## 2016-03-21 IMAGING — US US RENAL
1 series · 14 of 25 positions shown · non-contrast
Comparison: None.

CLINICAL DATA: Cystitis

EXAM:
RENAL / URINARY TRACT ULTRASOUND COMPLETE

[Series 1: us renal · 0.30mm/px · 14 of 49 slices shown]
[im 1/49]
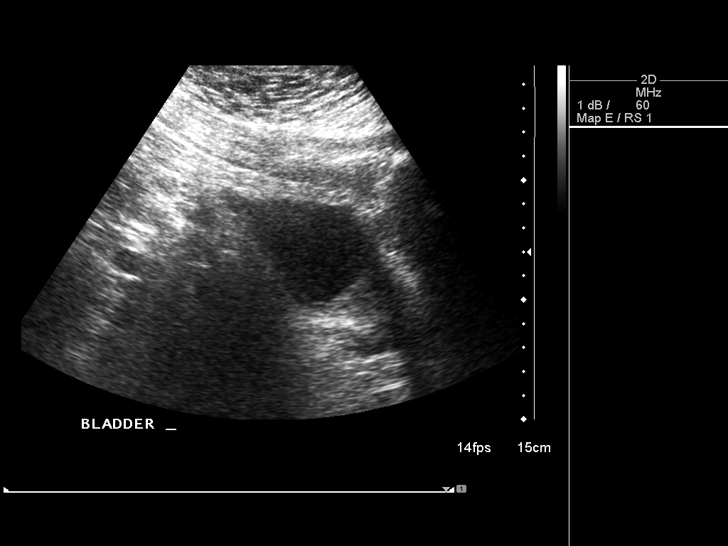
[im 5/49]
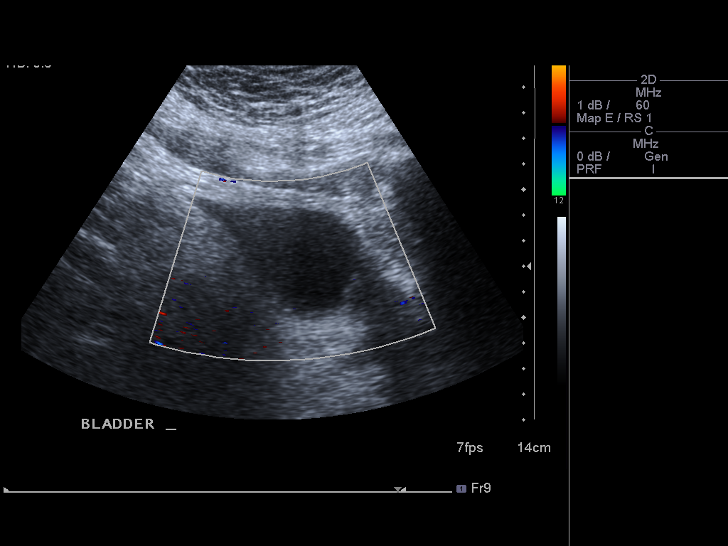
[im 9/49]
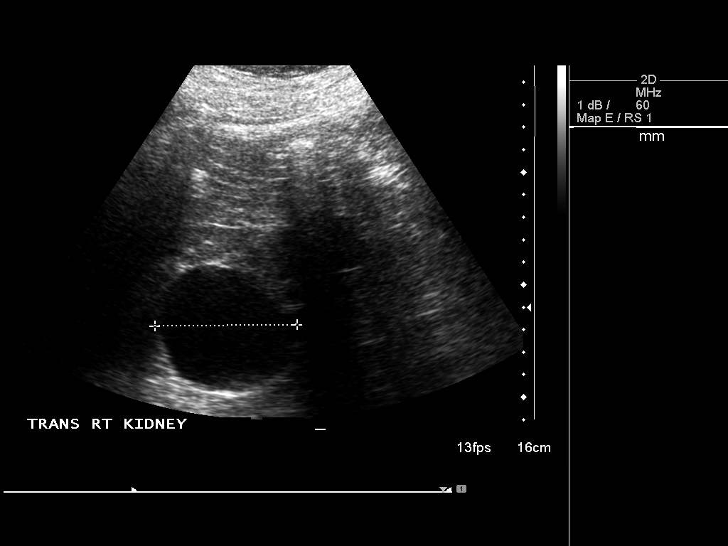
[im 13/49]
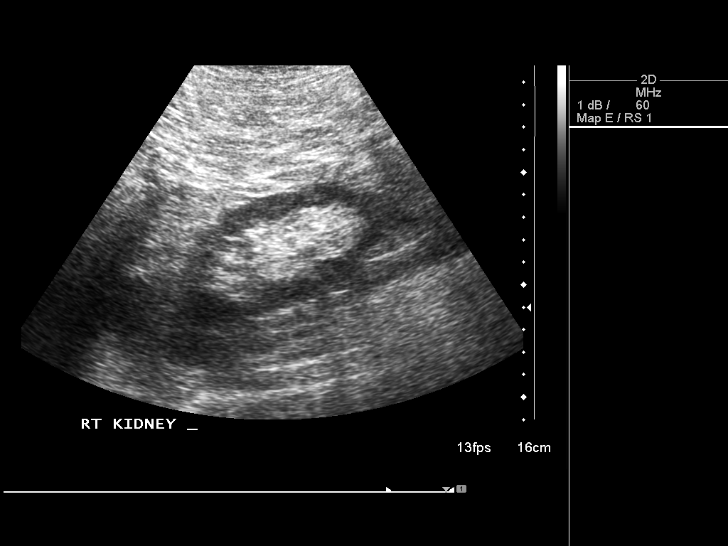
[im 17/49]
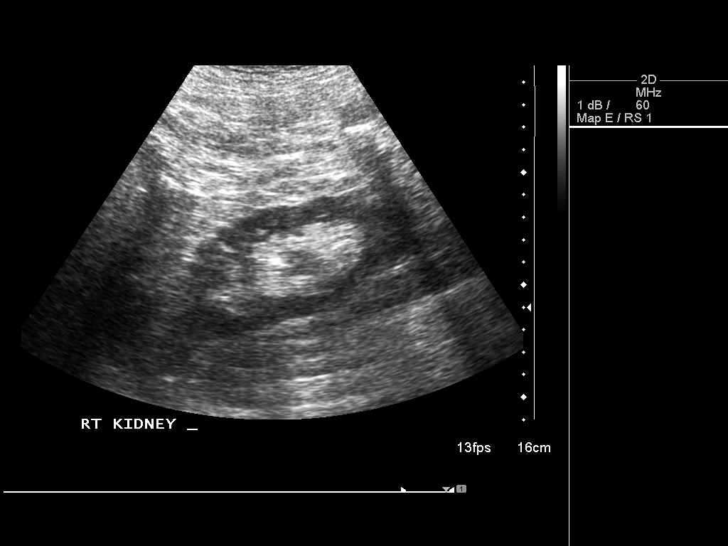
[im 19/49]
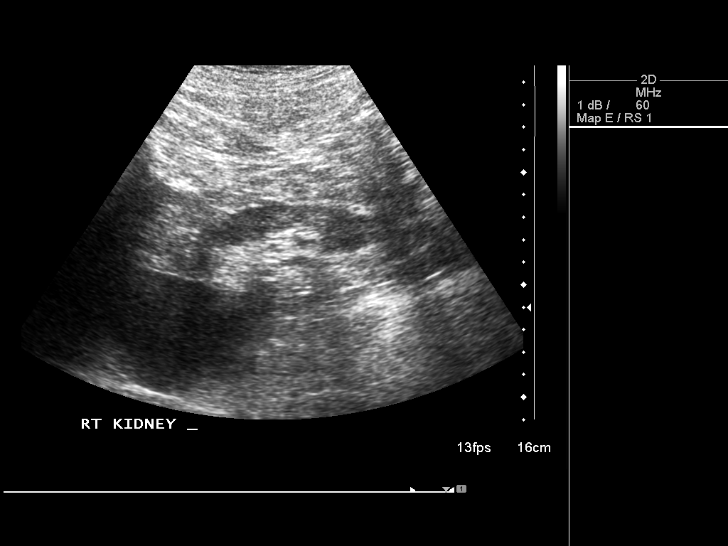
[im 23/49]
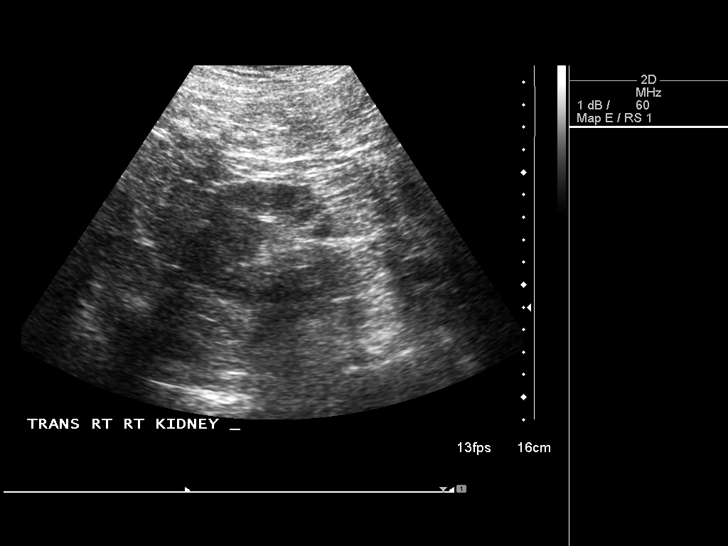
[im 27/49]
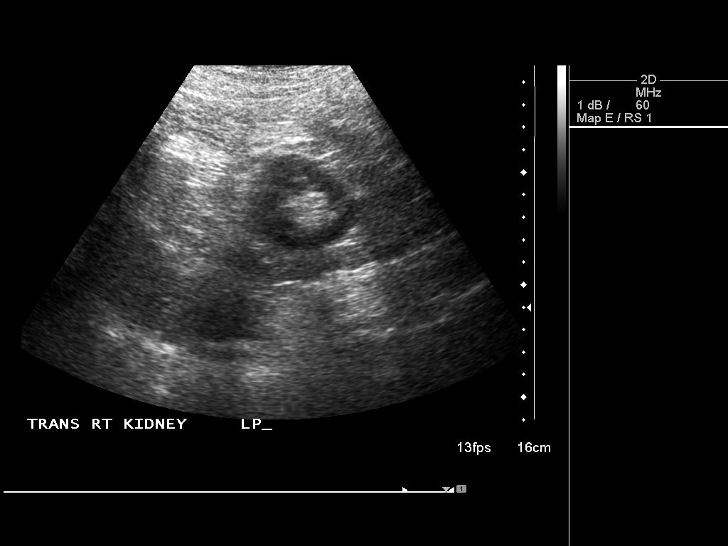
[im 31/49]
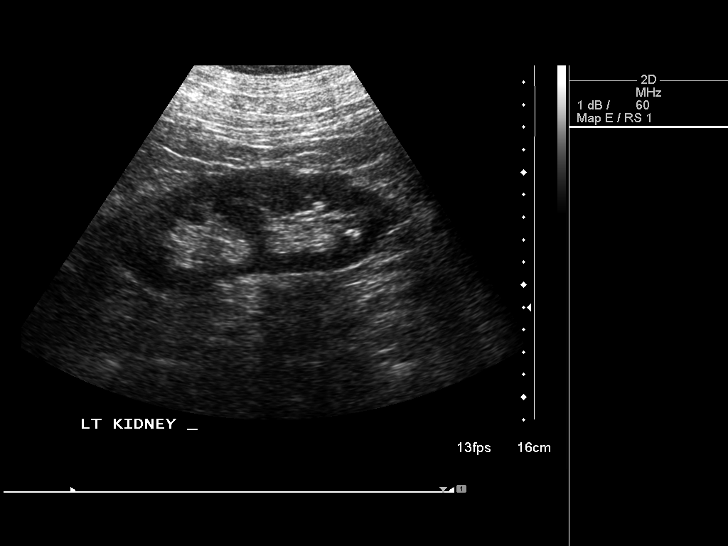
[im 33/49]
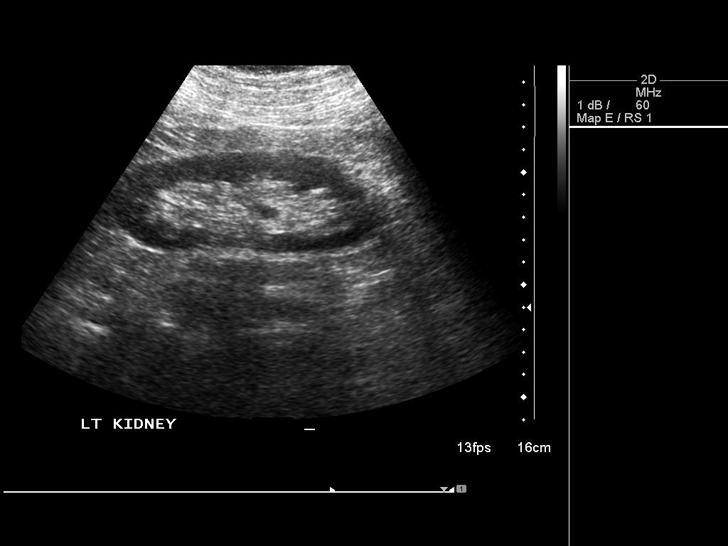
[im 37/49]
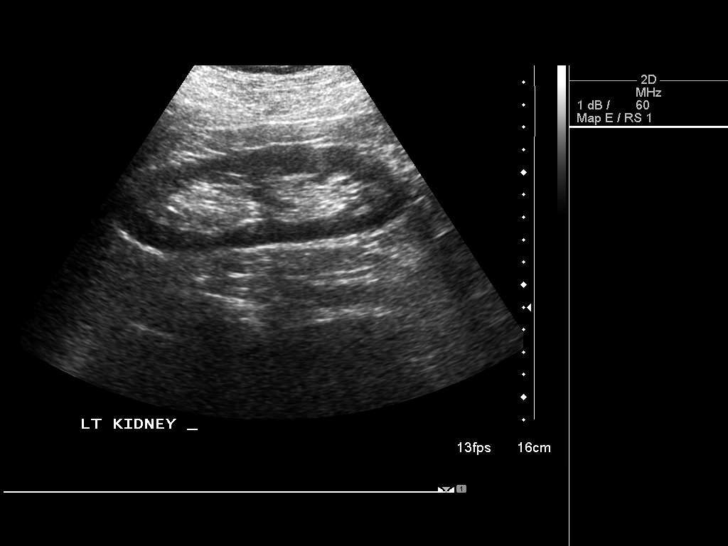
[im 41/49]
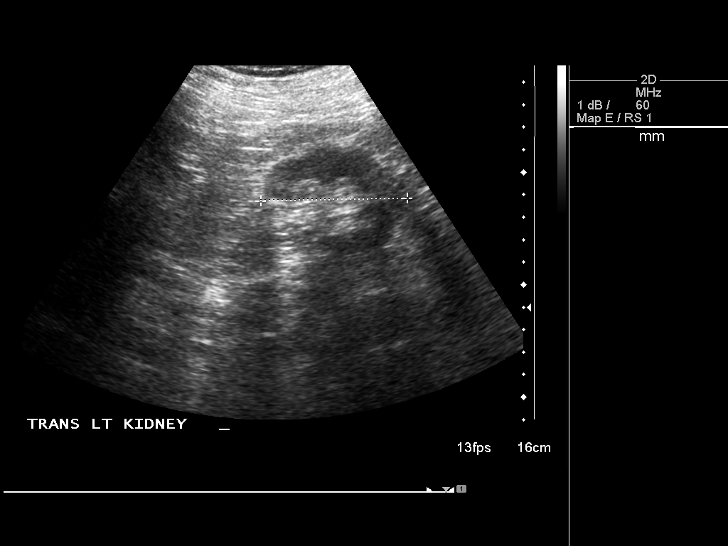
[im 45/49]
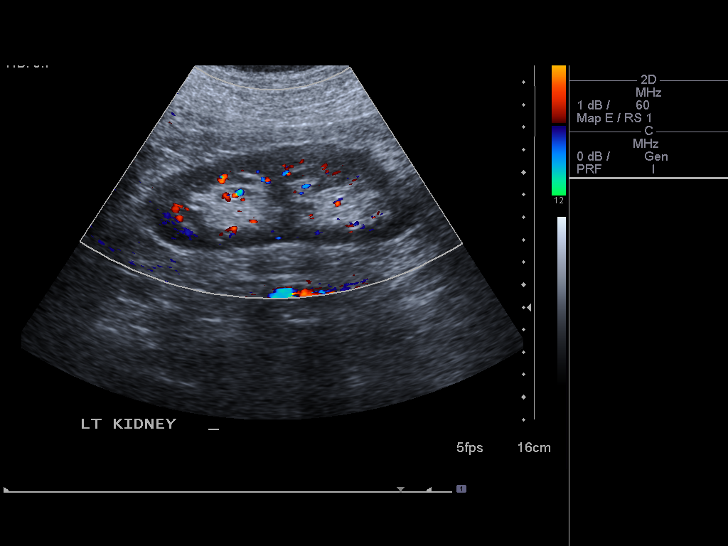
[im 49/49]
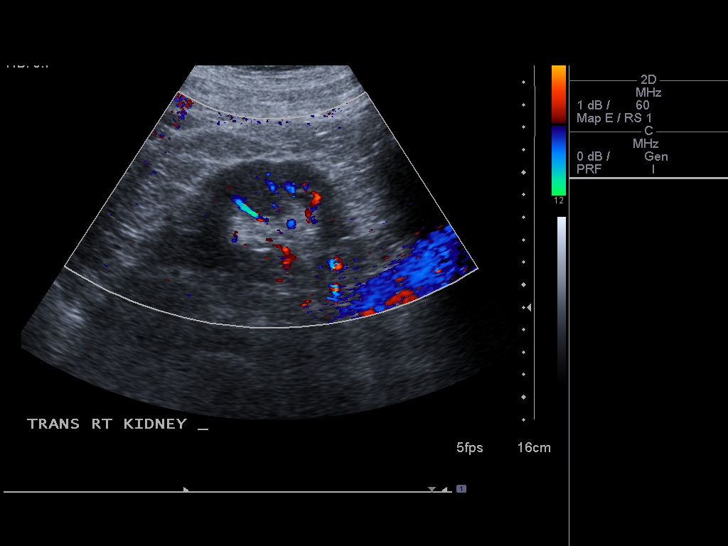

[14 of 25 positions shown; findings below may reference images not displayed]

FINDINGS: Right Kidney:

Length: 11.8 cm. 6.1 x 5.7 x 6.3 cm anechoic upper pole renal mass
most consistent with a cyst. 9 mm echogenic focus in the mid pole of
the right kidney likely representing a small nephrolithiasis..
Echogenicity within normal limits. No solid mass or hydronephrosis
visualized.

Left Kidney:

Length: 13.3 cm. Echogenicity within normal limits. No mass or
hydronephrosis visualized.

Bladder:

Appears normal for degree of bladder distention.
IMPRESSION: 1. No obstructive uropathy.
2. Right upper pole renal cyst.
3. 9 mm nonobstructing right nephrolithiasis.

## 2018-10-25 ENCOUNTER — Ambulatory Visit: Payer: Medicare Other | Admitting: Adult Health
